# Patient Record
Sex: Male | Born: 1950 | Smoking: Current every day smoker
Health system: Southern US, Community
[De-identification: ages and names within clinical notes are randomized; demographics above are authoritative.]

## PROBLEM LIST (undated history)

## (undated) DIAGNOSIS — M1990 Unspecified osteoarthritis, unspecified site: Secondary | ICD-10-CM

## (undated) DIAGNOSIS — I2510 Atherosclerotic heart disease of native coronary artery without angina pectoris: Secondary | ICD-10-CM

## (undated) DIAGNOSIS — E119 Type 2 diabetes mellitus without complications: Secondary | ICD-10-CM

## (undated) DIAGNOSIS — I10 Essential (primary) hypertension: Secondary | ICD-10-CM

## (undated) HISTORY — PX: HERNIA REPAIR: SHX51

---

## 2020-12-03 ENCOUNTER — Other Ambulatory Visit (HOSPITAL_COMMUNITY): Payer: Self-pay | Admitting: Physician Assistant

## 2020-12-03 DIAGNOSIS — R160 Hepatomegaly, not elsewhere classified: Secondary | ICD-10-CM

## 2020-12-03 DIAGNOSIS — R109 Unspecified abdominal pain: Secondary | ICD-10-CM

## 2020-12-03 DIAGNOSIS — R932 Abnormal findings on diagnostic imaging of liver and biliary tract: Secondary | ICD-10-CM

## 2020-12-11 ENCOUNTER — Other Ambulatory Visit (HOSPITAL_COMMUNITY): Payer: Self-pay | Admitting: Physician Assistant

## 2020-12-11 ENCOUNTER — Ambulatory Visit
Admission: RE | Admit: 2020-12-11 | Discharge: 2020-12-11 | Disposition: A | Discharge status: Home / Self Care | Payer: Self-pay | Source: Ambulatory Visit | Attending: Physician Assistant | Admitting: Physician Assistant

## 2020-12-11 DIAGNOSIS — R52 Pain, unspecified: Secondary | ICD-10-CM

## 2020-12-12 ENCOUNTER — Encounter (HOSPITAL_COMMUNITY): Payer: Self-pay | Admitting: Radiology

## 2020-12-12 NOTE — Progress Notes (Signed)
Benjamin Trujillo Male, 70 y.o., 09-16-1950  MRN:  [id number] Phone:  [phone] (H)       PCP:  None Primary Cvg:  None            RE: US Liver Biopsy Received: Today [name], MD  [name] for US guided liver lesion biopsy   [name]        Previous Messages   ----- Message -----  From: [name] D  Sent: 12/11/2020  6:18 PM EDT  To: [name], [place] Procedure Requests  Subject: US Liver Biopsy                  Please forward review to [name], [name] and [name] will be out of the office on 12/12/20, Thanks.   Procedure:  US Liver Biopsy   Reason: Liver mass  Abdominal pain, unspecified abdominal location  Abnormal findings on diagnostic imaging of liver and biliary tract  liver mass seen on CT scan   History:  outside imaging uploaded in PAC's   Provider: [name].   Provider Contact: [phone]

## 2020-12-19 ENCOUNTER — Ambulatory Visit (HOSPITAL_COMMUNITY)
Admission: RE | Admit: 2020-12-19 | Discharge: 2020-12-19 | Disposition: A | Discharge status: Home / Self Care | Payer: No Typology Code available for payment source | Source: Ambulatory Visit | Attending: Physician Assistant | Admitting: Physician Assistant

## 2020-12-19 ENCOUNTER — Other Ambulatory Visit: Payer: Self-pay

## 2020-12-19 ENCOUNTER — Encounter (HOSPITAL_COMMUNITY): Payer: Self-pay

## 2020-12-19 DIAGNOSIS — R160 Hepatomegaly, not elsewhere classified: Secondary | ICD-10-CM

## 2020-12-19 DIAGNOSIS — C220 Liver cell carcinoma: Secondary | ICD-10-CM | POA: Diagnosis present

## 2020-12-19 DIAGNOSIS — R932 Abnormal findings on diagnostic imaging of liver and biliary tract: Secondary | ICD-10-CM

## 2020-12-19 DIAGNOSIS — F17200 Nicotine dependence, unspecified, uncomplicated: Secondary | ICD-10-CM | POA: Diagnosis not present

## 2020-12-19 DIAGNOSIS — R109 Unspecified abdominal pain: Secondary | ICD-10-CM

## 2020-12-19 HISTORY — DX: Atherosclerotic heart disease of native coronary artery without angina pectoris: I25.10

## 2020-12-19 HISTORY — DX: Essential (primary) hypertension: I10

## 2020-12-19 HISTORY — DX: Unspecified osteoarthritis, unspecified site: M19.90

## 2020-12-19 HISTORY — DX: Type 2 diabetes mellitus without complications: E11.9

## 2020-12-19 LAB — CBC
HCT: 39 % (ref 39.0–52.0)
Hemoglobin: 12.6 g/dL — ABNORMAL LOW (ref 13.0–17.0)
MCH: 26.8 pg (ref 26.0–34.0)
MCHC: 32.3 g/dL (ref 30.0–36.0)
MCV: 82.8 fL (ref 80.0–100.0)
Platelets: 197 10*3/uL (ref 150–400)
RBC: 4.71 MIL/uL (ref 4.22–5.81)
RDW: 13.2 % (ref 11.5–15.5)
WBC: 4.7 10*3/uL (ref 4.0–10.5)
nRBC: 0 % (ref 0.0–0.2)

## 2020-12-19 LAB — PROTIME-INR
INR: 0.9 (ref 0.8–1.2)
Prothrombin Time: 12.3 seconds (ref 11.4–15.2)

## 2020-12-19 LAB — GLUCOSE, CAPILLARY: Glucose-Capillary: 105 mg/dL — ABNORMAL HIGH (ref 70–99)

## 2020-12-19 MED ORDER — GELATIN ABSORBABLE 12-7 MM EX MISC
CUTANEOUS | Status: AC
  Filled 2020-12-19: qty 1

## 2020-12-19 MED ORDER — MIDAZOLAM HCL 2 MG/2ML IJ SOLN
INTRAMUSCULAR | Status: AC | PRN
  Administered 2020-12-19: 1 mg via INTRAVENOUS
  Administered 2020-12-19: 0.5 mg via INTRAVENOUS

## 2020-12-19 MED ORDER — LIDOCAINE HCL 1 % IJ SOLN
INTRAMUSCULAR | Status: AC
  Filled 2020-12-19: qty 20

## 2020-12-19 MED ORDER — FENTANYL CITRATE (PF) 100 MCG/2ML IJ SOLN
INTRAMUSCULAR | Status: AC | PRN
  Administered 2020-12-19: 50 ug via INTRAVENOUS

## 2020-12-19 NOTE — Procedures (Signed)
Interventional Radiology Procedure Note  Procedure: Liver mass biopsy  Indication: Right liver mass  Findings: Please refer to procedural dictation for full description.  Complications: None  EBL: < 10 mL  Miachel Roux, MD [phone]

## 2020-12-19 NOTE — Discharge Instructions (Signed)
Please call Interventional Radiology clinic [phone] with any questions or concerns.  You may remove your dressing and shower tomorrow.   Liver Biopsy, Care After These instructions give you information on caring for yourself after your procedure. Your doctor may also give you more specific instructions. Call your doctor if you have any problems or questions after your procedure. What can I expect after the procedure? After the procedure, it is common to have:  Pain and soreness where the biopsy was done.  Bruising around the area where the biopsy was done.  Sleepiness and be tired for a few days. Follow these instructions at home: Medicines  Take over-the-counter and prescription medicines only as told by your doctor.  If you were prescribed an antibiotic medicine, take it as told by your doctor. Do not stop taking the antibiotic even if you start to feel better.  Do not take medicines such as aspirin and ibuprofen. These medicines can thin your blood. Do not take these medicines unless your doctor tells you to take them.  If you are taking prescription pain medicine, take actions to prevent or treat constipation. Your doctor may recommend that you: ? Drink enough fluid to keep your pee (urine) clear or pale yellow. ? Take over-the-counter or prescription medicines. ? Eat foods that are high in fiber, such as fresh fruits and vegetables, whole grains, and beans. ? Limit foods that are high in fat and processed sugars, such as fried and sweet foods. Caring for your cut  Follow instructions from your doctor about how to take care of your cuts from surgery (incisions). Make sure you: ? Wash your hands with soap and water before you change your bandage (dressing). If you cannot use soap and water, use hand sanitizer. ? Change your bandage as told by your doctor. ? Leave stitches (sutures), skin glue, or skin tape (adhesive) strips in place. They may need to stay in place for 2  weeks or longer. If tape strips get loose and curl up, you may trim the loose edges. Do not remove tape strips completely unless your doctor says it is okay.  Check your cuts every day for signs of infection. Check for: ? Redness, swelling, or more pain. ? Fluid or blood. ? Pus or a bad smell. ? Warmth.  Do not take baths, swim, or use a hot tub until your doctor says it is okay to do so. Activity  Rest at home for 1-2 days or as told by your doctor. ? Avoid sitting for a long time without moving. Get up to take short walks every 1-2 hours.  Return to your normal activities as told by your doctor. Ask what activities are safe for you.  Do not do these things in the first 24 hours: ? Drive. ? Use machinery. ? Take a bath or shower.  Do not lift more than 10 pounds (4.5 kg) or play contact sports for the first 2 weeks.   General instructions  Do not drink alcohol in the first week after the procedure.  Have someone stay with you for at least 24 hours after the procedure.  Get your test results. Ask your doctor or the department that is doing the test: ? When will my results be ready? ? How will I get my results? ? What are my treatment options? ? What other tests do I need? ? What are my next steps?  Keep all follow-up visits as told by your doctor. This is important.   Contact   a doctor if:  A cut bleeds and leaves more than just a small spot of blood.  A cut is red, puffs up (swells), or hurts more than before.  Fluid or something else comes from a cut.  A cut smells bad.  You have a fever or chills. Get help right away if:  You have swelling, bloating, or pain in your belly (abdomen).  You get dizzy or faint.  You have a rash.  You feel sick to your stomach (nauseous) or throw up (vomit).  You have trouble breathing, feel short of breath, or feel faint.  Your chest hurts.  You have problems talking or seeing.  You have trouble with your balance or moving  your arms or legs. Summary  After the procedure, it is common to have pain, soreness, bruising, and tiredness.  Your doctor will tell you how to take care of yourself at home. Change your bandage, take your medicines, and limit your activities as told by your doctor.  Call your doctor if you have symptoms of infection. Get help right away if your belly swells, your cut bleeds a lot, or you have trouble talking or breathing. This information is not intended to replace advice given to you by your health care provider. Make sure you discuss any questions you have with your health care provider. Document Revised: 08/26/2017 Document Reviewed: 08/27/2017 Elsevier Patient Education  2021 [employer]   Moderate Conscious Sedation, Adult, Care After This sheet gives you information about how to care for yourself after your procedure. Your health care provider may also give you more specific instructions. If you have problems or questions, contact your health care provider. What can I expect after the procedure? After the procedure, it is common to have:  Sleepiness for several hours.  Impaired judgment for several hours.  Difficulty with balance.  Vomiting if you eat too soon. Follow these instructions at home: For the time period you were told by your health care provider:  Rest.  Do not participate in activities where you could fall or become injured.  Do not drive or use machinery.  Do not drink alcohol.  Do not take sleeping pills or medicines that cause drowsiness.  Do not make important decisions or sign legal documents.  Do not take care of children on your own.      Eating and drinking  Follow the diet recommended by your health care provider.  Drink enough fluid to keep your urine pale yellow.  If you vomit: ? Drink water, juice, or soup when you can drink without vomiting. ? Make sure you have little or no nausea before eating solid foods.   General  instructions  Take over-the-counter and prescription medicines only as told by your health care provider.  Have a responsible adult stay with you for the time you are told. It is important to have someone help care for you until you are awake and alert.  Do not smoke.  Keep all follow-up visits as told by your health care provider. This is important. Contact a health care provider if:  You are still sleepy or having trouble with balance after 24 hours.  You feel light-headed.  You keep feeling nauseous or you keep vomiting.  You develop a rash.  You have a fever.  You have redness or swelling around the IV site. Get help right away if:  You have trouble breathing.  You have new-onset confusion at home. Summary  After the procedure, it is   common to feel sleepy, have impaired judgment, or feel nauseous if you eat too soon.  Rest after you get home. Know the things you should not do after the procedure.  Follow the diet recommended by your health care provider and drink enough fluid to keep your urine pale yellow.  Get help right away if you have trouble breathing or new-onset confusion at home. This information is not intended to replace advice given to you by your health care provider. Make sure you discuss any questions you have with your health care provider. Document Revised: 12/15/2019 Document Reviewed: 07/13/2019 Elsevier Patient Education  2021 Elsevier Inc.  

## 2020-12-19 NOTE — H&P (Signed)
Chief Complaint: Liver lesion  Referring Physician(s): Robinette Haines  Supervising Physician: Mir, Sharen Heck  Patient Status: [facility] - Out-pt  History of Present Illness: [name] is a 70 y.o. male who is here today for biopsy of a liver lesion.  He tells me he developed severe RUQ pain. He went to the ER at the [place] where CT scan was obtained.  CT showed a liver mass.  He is NPO. No nausea/vomiting. No Fever/chills. ROS negative.   Past Medical History:  Diagnosis Date  . Arthritis   . Coronary artery disease   . Diabetes mellitus without complication ([place])   . Hypertension     Past Surgical History:  Procedure Laterality Date  . HERNIA REPAIR      Allergies: Patient has no known allergies.  Medications: Prior to Admission medications   Not on File     History reviewed. No pertinent family history.  Social History   Socioeconomic History  . Marital status: Unknown    Spouse name: Not on file  . Number of children: Not on file  . Years of education: Not on file  . Highest education level: Not on file  Occupational History  . Not on file  Tobacco Use  . Smoking status: Current Every Day Smoker  . Smokeless tobacco: Never Used  Substance and Sexual Activity  . Alcohol use: Not on file  . Drug use: Not on file  . Sexual activity: Not on file  Other Topics Concern  . Not on file  Social History Narrative  . Not on file   Social Determinants of Health   Financial Resource Strain: Not on file  Food Insecurity: Not on file  Transportation Needs: Not on file  Physical Activity: Not on file  Stress: Not on file  Social Connections: Not on file     Review of Systems: A 12 point ROS discussed and pertinent positives are indicated in the HPI above.  All other systems are negative.  Review of Systems  Vital Signs: BP 131/83   Pulse 61   Temp 98.2 F (36.8 C) (Oral)   Resp 18   Ht 5' 10.5" (1.791 m)   Wt 102.1 kg   SpO2 100%   BMI  31.83 kg/m   Physical Exam Vitals reviewed.  Constitutional:      Appearance: Normal appearance.  HENT:     Head: Normocephalic and atraumatic.  Eyes:     Extraocular Movements: Extraocular movements intact.  Cardiovascular:     Rate and Rhythm: Normal rate and regular rhythm.  Pulmonary:     Effort: Pulmonary effort is normal. No respiratory distress.     Breath sounds: Normal breath sounds.  Abdominal:     General: There is no distension.     Palpations: Abdomen is soft.     Tenderness: There is no abdominal tenderness.  Musculoskeletal:        General: Normal range of motion.     Cervical back: Normal range of motion.  Skin:    General: Skin is warm and dry.  Neurological:     General: No focal deficit present.     Mental Status: He is alert and oriented to person, place, and time.  Psychiatric:        Mood and Affect: Mood normal.        Behavior: Behavior normal.        Thought Content: Thought content normal.        Judgment: Judgment  normal.     Imaging: No results found.  Labs:  CBC: Recent Labs    12/19/20 1107  WBC 4.7  HGB 12.6*  HCT 39.0  PLT 197    COAGS: Recent Labs    12/19/20 1107  INR 0.9    BMP: No results for input(s): NA, K, CL, CO2, GLUCOSE, BUN, CALCIUM, CREATININE, GFRNONAA, GFRAA in the last 8760 hours.  Invalid input(s): CMP  LIVER FUNCTION TESTS: No results for input(s): BILITOT, AST, ALT, ALKPHOS, PROT, ALBUMIN in the last 8760 hours.  TUMOR MARKERS: No results for input(s): AFPTM, CEA, CA199, CHROMGRNA in the last 8760 hours.  Assessment and Plan:  Liver mass seen on CT done at the [place].  Will proceed with image guided biopsy of the mass today by Dr. Dwaine Gale.  Risks and benefits of liver mass biopsy was discussed with the patient and/or patient's family including, but not limited to bleeding, infection, damage to adjacent structures or low yield requiring additional tests.  All of the questions were answered and there  is agreement to proceed.  Consent signed and in chart.  Thank you for this interesting consult.  I greatly enjoyed meeting Tinsley Lomas and look forward to participating in their care.  A copy of this report was sent to the requesting provider on this date.  Electronically Signed: Murrell Redden, PA-C   12/19/2020, 11:56 AM      I spent a total of  30 Minutes in face to face in clinical consultation, greater than 50% of which was counseling/coordinating care for liver lesion biopsy.

## 2020-12-20 LAB — SURGICAL PATHOLOGY

## 2021-01-01 ENCOUNTER — Encounter (HOSPITAL_COMMUNITY): Payer: Self-pay

## 2022-05-13 IMAGING — US US BIOPSY CORE LIVER
1 series · 15 of 25 positions shown · non-contrast
Comparison: none

INDICATION: Right liver mass

[Series 1: us biopsy mc & wl · 15 of 27 slices shown]
[im 1/27]
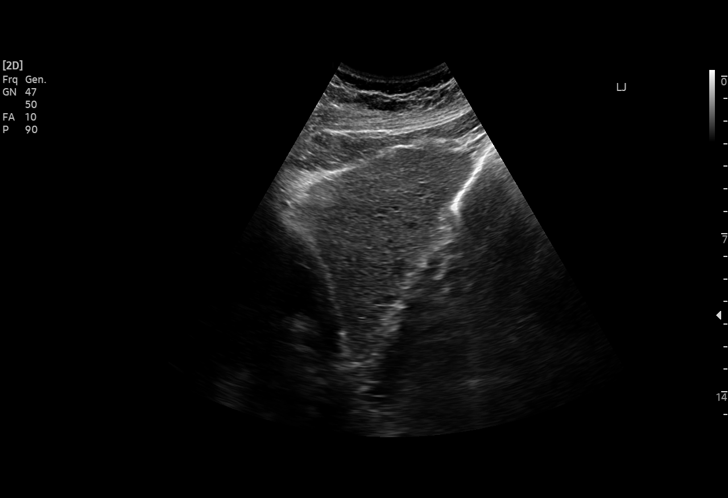
[im 3/27]
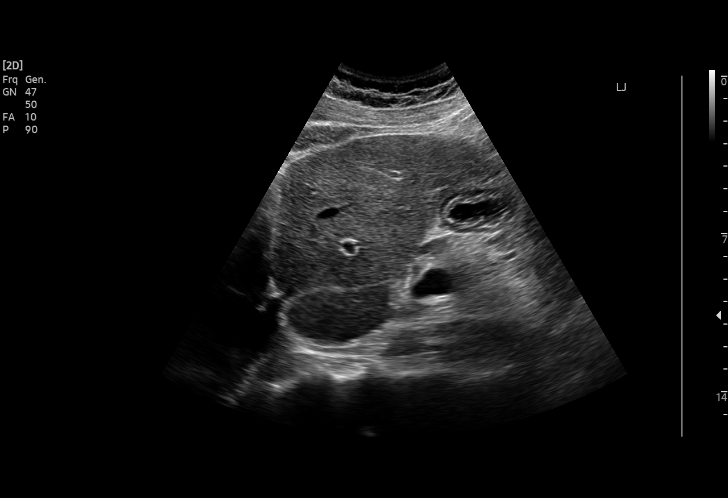
[im 5/27]
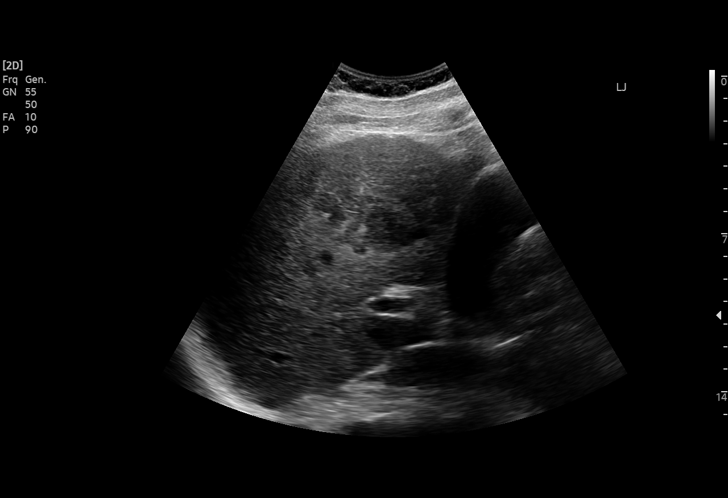
[im 6/27]
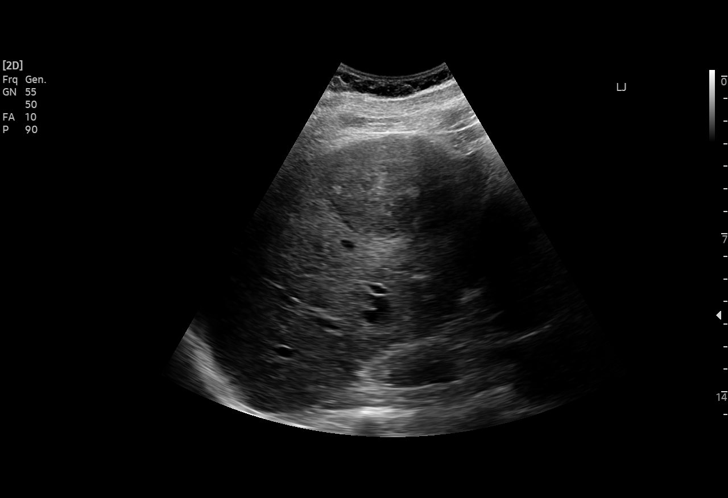
[im 8/27]
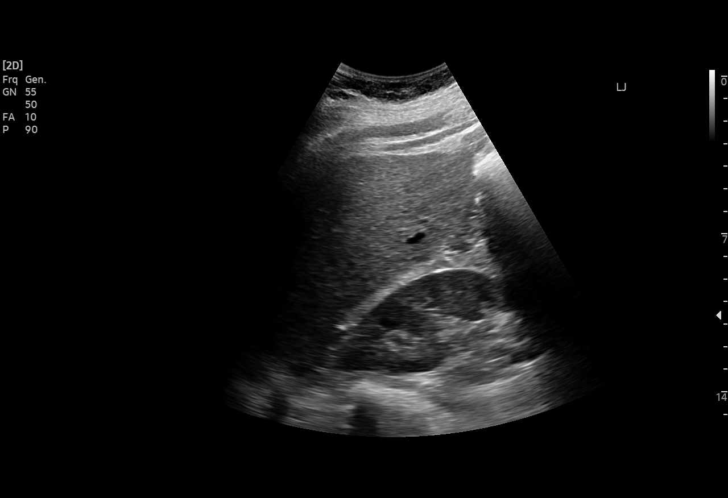
[im 10/27]
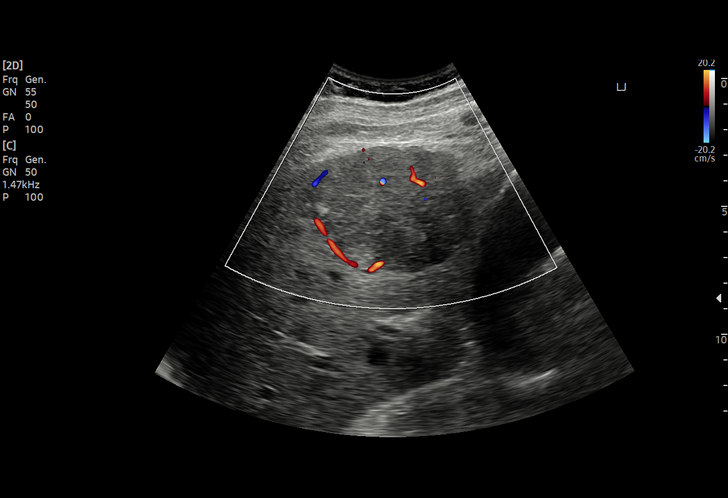
[im 11/27]
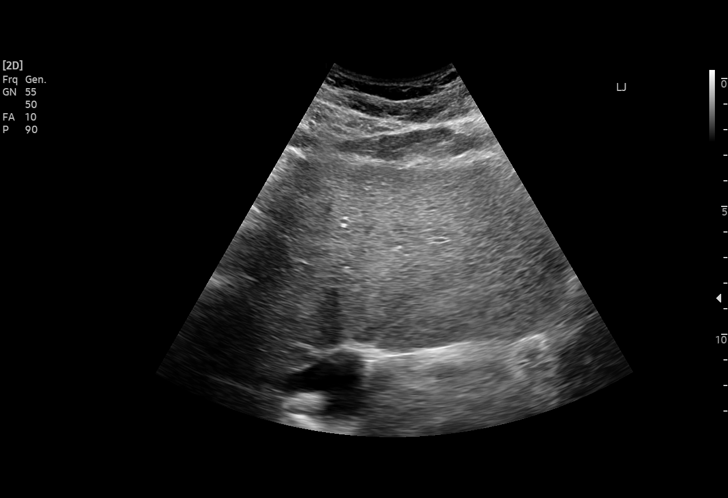
[im 14/27]
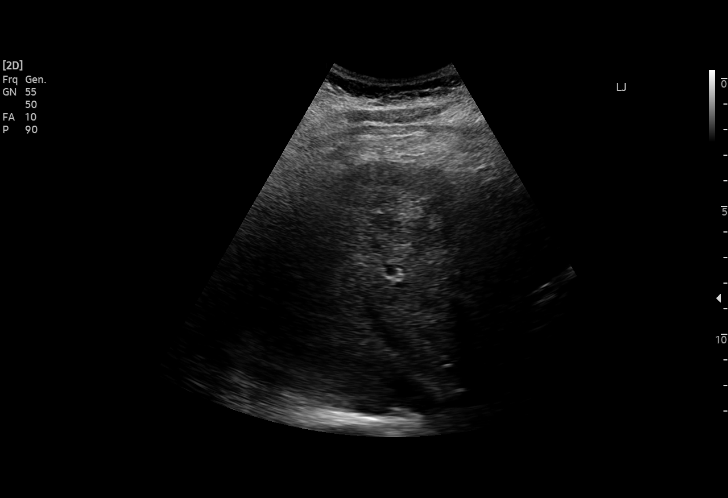
[im 16/27]
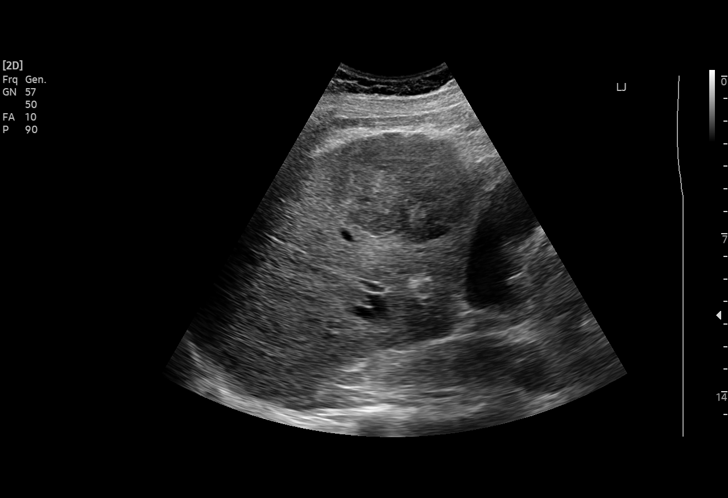
[im 17/27]
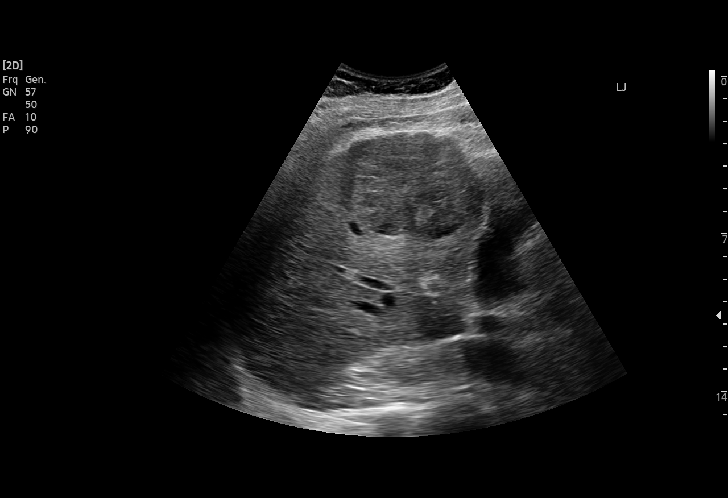
[im 19/27]
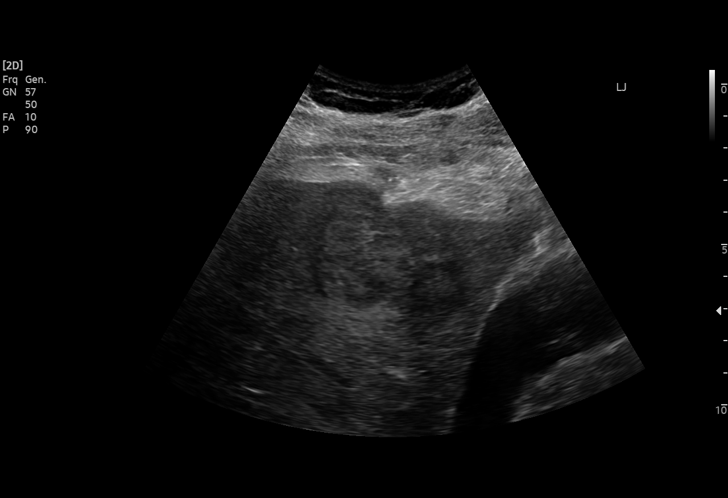
[im 21/27]
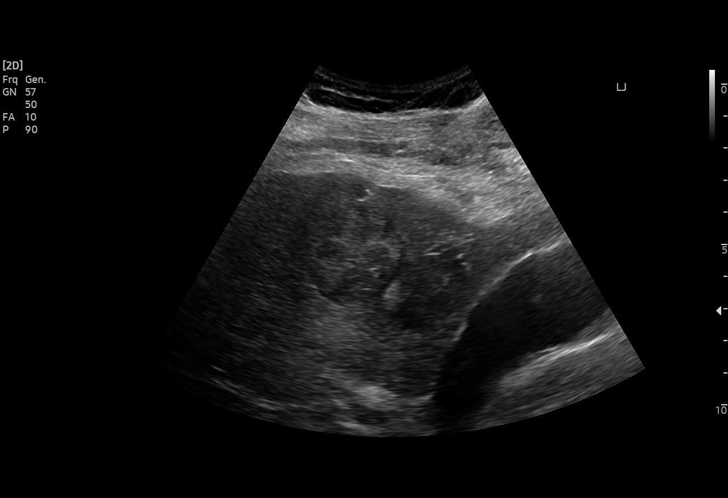
[im 22/27]
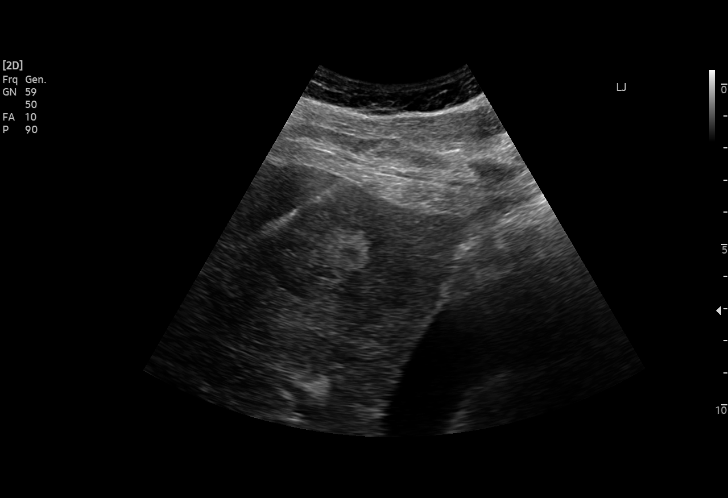
[im 24/27]
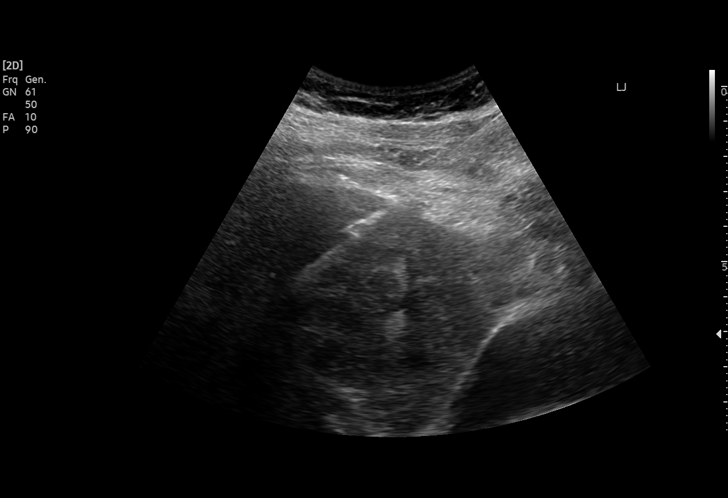
[im 27/27]
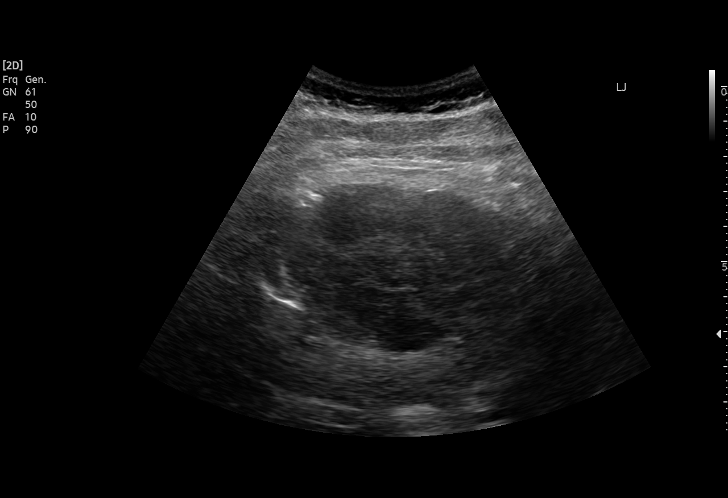

[15 of 25 positions shown; findings below may reference images not displayed]

EXAM:
Ultrasound-guided biopsy of right liver mass

MEDICATIONS:
None.

ANESTHESIA/SEDATION:
Moderate (conscious) sedation was employed during this procedure. A
total of Versed 1.5 mg and Fentanyl 50 mcg was administered
intravenously.

Moderate Sedation Time: 12 minutes. The patient's level of
consciousness and vital signs were monitored continuously by
radiology nursing throughout the procedure under my direct
supervision.

COMPLICATIONS:
None immediate.

PROCEDURE:
Informed written consent was obtained from the patient after a
thorough discussion of the procedural risks, benefits and
alternatives. All questions were addressed. Maximal Sterile Barrier
Technique was utilized including caps, mask, sterile gowns, sterile
gloves, sterile drape, hand hygiene and skin antiseptic. A timeout
was performed prior to the initiation of the procedure.

Patient position supine on the ultrasound table.

Right upper quadrant skin prepped and draped in usual sterile
fashion.

Following local lidocaine administration, 17 gauge introducer needle
was advanced into the right hepatic lobe lesion, and three 18 gauge
cores were obtained utilizing continuous ultrasound guidance.

Gelfoam slurry was administered through the introducer needle at the
biopsy site.

Samples were sent to pathology in formalin.

Needle removed and hemostasis achieved with 5 minutes of manual
compression.

Post procedure ultrasound images showed no evidence of significant
hemorrhage.
IMPRESSION: Ultrasound-guided biopsy of right liver mass as above.
